# Patient Record
Sex: Female | Born: 1970 | Race: White | Hispanic: No | Marital: Married | Smoking: Former smoker
Health system: Southern US, Community
[De-identification: ages and names within clinical notes are randomized; demographics above are authoritative.]

## PROBLEM LIST (undated history)

## (undated) DIAGNOSIS — E669 Obesity, unspecified: Secondary | ICD-10-CM

## (undated) DIAGNOSIS — O24419 Gestational diabetes mellitus in pregnancy, unspecified control: Secondary | ICD-10-CM

## (undated) DIAGNOSIS — E039 Hypothyroidism, unspecified: Secondary | ICD-10-CM

## (undated) DIAGNOSIS — N6019 Diffuse cystic mastopathy of unspecified breast: Secondary | ICD-10-CM

## (undated) DIAGNOSIS — E785 Hyperlipidemia, unspecified: Secondary | ICD-10-CM

## (undated) DIAGNOSIS — C73 Malignant neoplasm of thyroid gland: Secondary | ICD-10-CM

## (undated) DIAGNOSIS — N200 Calculus of kidney: Secondary | ICD-10-CM

## (undated) HISTORY — PX: BREAST LUMPECTOMY: SHX2

## (undated) HISTORY — DX: Hypothyroidism, unspecified: E03.9

## (undated) HISTORY — PX: THYROIDECTOMY: SHX17

## (undated) HISTORY — DX: Hyperlipidemia, unspecified: E78.5

## (undated) HISTORY — PX: WISDOM TOOTH EXTRACTION: SHX21

## (undated) HISTORY — PX: CHOLECYSTECTOMY: SHX55

## (undated) HISTORY — DX: Malignant neoplasm of thyroid gland: C73

## (undated) HISTORY — DX: Calculus of kidney: N20.0

## (undated) HISTORY — DX: Diffuse cystic mastopathy of unspecified breast: N60.19

## (undated) HISTORY — DX: Gestational diabetes mellitus in pregnancy, unspecified control: O24.419

## (undated) HISTORY — DX: Obesity, unspecified: E66.9

---

## 2011-07-20 ENCOUNTER — Other Ambulatory Visit: Payer: Self-pay | Admitting: Family Medicine

## 2011-07-20 DIAGNOSIS — Z1231 Encounter for screening mammogram for malignant neoplasm of breast: Secondary | ICD-10-CM

## 2011-08-10 ENCOUNTER — Ambulatory Visit
Admission: RE | Admit: 2011-08-10 | Discharge: 2011-08-10 | Disposition: A | Discharge status: Home / Self Care | Payer: 59 | Source: Ambulatory Visit | Attending: Family Medicine | Admitting: Family Medicine

## 2011-08-10 DIAGNOSIS — Z1231 Encounter for screening mammogram for malignant neoplasm of breast: Secondary | ICD-10-CM

## 2011-08-11 ENCOUNTER — Other Ambulatory Visit: Payer: Self-pay | Admitting: Family Medicine

## 2011-08-11 DIAGNOSIS — N63 Unspecified lump in unspecified breast: Secondary | ICD-10-CM

## 2011-08-28 ENCOUNTER — Ambulatory Visit
Admission: RE | Admit: 2011-08-28 | Discharge: 2011-08-28 | Disposition: A | Discharge status: Home / Self Care | Payer: 59 | Source: Ambulatory Visit | Attending: Family Medicine | Admitting: Family Medicine

## 2011-08-28 ENCOUNTER — Other Ambulatory Visit: Payer: Self-pay | Admitting: Family Medicine

## 2011-08-28 DIAGNOSIS — N63 Unspecified lump in unspecified breast: Secondary | ICD-10-CM

## 2012-04-20 ENCOUNTER — Ambulatory Visit: Payer: 59 | Admitting: Cardiology

## 2012-05-24 ENCOUNTER — Ambulatory Visit: Payer: 59 | Admitting: Cardiology

## 2012-07-07 ENCOUNTER — Encounter: Payer: Self-pay | Admitting: Cardiology

## 2012-07-07 ENCOUNTER — Encounter: Payer: Self-pay | Admitting: *Deleted

## 2012-07-07 ENCOUNTER — Ambulatory Visit (INDEPENDENT_AMBULATORY_CARE_PROVIDER_SITE_OTHER): Payer: 59 | Admitting: Cardiology

## 2012-07-07 VITALS — BP 108/60 | HR 53 | Wt 261.0 lb

## 2012-07-07 DIAGNOSIS — R002 Palpitations: Secondary | ICD-10-CM | POA: Insufficient documentation

## 2012-07-07 DIAGNOSIS — E039 Hypothyroidism, unspecified: Secondary | ICD-10-CM

## 2012-07-07 NOTE — Progress Notes (Signed)
  HPI: extremely pleasant 41 year old female for evaluation of palpitations. Patient states that since her thyroid was removed in 2008 she has had occasional palpitations. They are described as a brief flutter and nonsustained. She otherwise does not have dyspnea on exertion, orthopnea, PND, pedal edema, syncope or chest pain.  Current Outpatient Prescriptions  Medication Sig Dispense Refill  . SYNTHROID 150 MCG tablet Take 1 tablet by mouth Daily.        Allergies  Allergen Reactions  . Ciprofloxacin     Past Medical History  Diagnosis Date  . Hypothyroidism   . Obesity   . Fibrocystic breast disease   . Thyroid cancer   . Hyperlipidemia   . Nephrolithiasis   . Gestational diabetes mellitus     Past Surgical History  Procedure Date  . Cholecystectomy   . Thyroidectomy   . Breast lumpectomy     L breast; benign    History   Social History  . Marital Status: Married    Spouse Name: N/A    Number of Children: 2  . Years of Education: N/A   Occupational History  .      Homemaker   Social History Main Topics  . Smoking status: Former Games developer  . Smokeless tobacco: Not on file  . Alcohol Use: Yes     Occasional  . Drug Use: No  . Sexually Active: Not on file   Other Topics Concern  . Not on file   Social History Narrative  . No narrative on file    Family History  Problem Relation Age of Onset  . Heart disease Father     Atrial fibrillation and pacemaker    ROS: no fevers or chills, productive cough, hemoptysis, dysphasia, odynophagia, melena, hematochezia, dysuria, hematuria, rash, seizure activity, orthopnea, PND, pedal edema, claudication. Remaining systems are negative.  Physical Exam:   Blood pressure 108/60, pulse 53, weight 261 lb (118.389 kg).  General:  Well developed/obese in NAD Skin warm/dry Patient not depressed No peripheral clubbing Back-normal HEENT-normal/normal eyelids Neck supple/normal carotid upstroke bilaterally; no bruits; no  JVD; no thyromegaly chest - CTA/ normal expansion CV - RRR/normal S1 and S2; no murmurs, rubs or gallops;  PMI nondisplaced Abdomen -NT/ND, no HSM, no mass, + bowel sounds, no bruit 2+ femoral pulses, no bruits Ext-no edema, chords, 2+ DP Neuro-grossly nonfocal  ECG sinus rhythm at a rate of 53. No significant ST changes.

## 2012-07-07 NOTE — Assessment & Plan Note (Signed)
Most likely PACs or PVCs. Schedule echocardiogram. Her thyroid is followed closely by primary care. We discussed a CardioNet. However her symptoms do not sound to be significantly problematic. If they worsen we will consider a monitor in the future. We could also consider a beta blocker in the future if needed.

## 2012-07-07 NOTE — Assessment & Plan Note (Signed)
Continue Synthroid °

## 2012-07-07 NOTE — Patient Instructions (Addendum)
Your physician recommends that you schedule a follow-up appointment in: AS NEEDED  Your physician has requested that you have an echocardiogram. Echocardiography is a painless test that uses sound waves to create images of your heart. It provides your doctor with information about the size and shape of your heart and how well your heart's chambers and valves are working. This procedure takes approximately one hour. There are no restrictions for this procedure.   

## 2012-07-21 ENCOUNTER — Ambulatory Visit (HOSPITAL_COMMUNITY): Payer: 59 | Attending: Cardiology | Admitting: Radiology

## 2012-07-21 DIAGNOSIS — R002 Palpitations: Secondary | ICD-10-CM | POA: Insufficient documentation

## 2012-07-21 NOTE — Progress Notes (Signed)
Echocardiogram performed.  

## 2012-07-27 ENCOUNTER — Telehealth: Payer: Self-pay | Admitting: Cardiology

## 2012-07-27 NOTE — Telephone Encounter (Signed)
Pt rtn debra's call from monday

## 2012-07-27 NOTE — Telephone Encounter (Signed)
Spoke with pt, aware of normal echo results 

## 2012-08-11 ENCOUNTER — Other Ambulatory Visit: Payer: Self-pay | Admitting: Family Medicine

## 2012-08-11 DIAGNOSIS — N63 Unspecified lump in unspecified breast: Secondary | ICD-10-CM

## 2012-08-18 ENCOUNTER — Ambulatory Visit
Admission: RE | Admit: 2012-08-18 | Discharge: 2012-08-18 | Disposition: A | Discharge status: Home / Self Care | Payer: 59 | Source: Ambulatory Visit | Attending: Family Medicine | Admitting: Family Medicine

## 2012-08-18 DIAGNOSIS — N63 Unspecified lump in unspecified breast: Secondary | ICD-10-CM

## 2013-04-03 NOTE — H&P (Signed)
Deborah Vang is an 42 y.o. female. She is admitted for NovaSure endometrial ablation.  Pertinent Gynecological History: Menses: Heavy and lasts 7-10 days per month Contraception: none(conceived with IVF) DES exposure: denies Blood transfusions: none Sexually transmitted diseases: no past history Previous GYN Procedures: DNC  Last mammogram: normal  Last pap: normal Date: 2013 OB History: G1, P1102(twins)     Past Medical History  Diagnosis Date  . Hypothyroidism   . Obesity   . Fibrocystic breast disease   . Thyroid cancer   . Hyperlipidemia   . Nephrolithiasis   . Gestational diabetes mellitus     Past Surgical History  Procedure Laterality Date  . Cholecystectomy    . Thyroidectomy    . Breast lumpectomy      L breast; benign  Cesarean section   Family History  Problem Relation Age of Onset  . Heart disease Father     Atrial fibrillation and pacemaker    Social History:  reports that she has quit smoking. She does not have any smokeless tobacco history on file. She reports that  drinks alcohol. She reports that she does not use illicit drugs.  Allergies:  Allergies  Allergen Reactions  . Ciprofloxacin     No prescriptions prior to admission    Review of Systems  Constitutional: Negative.   HENT: Negative.   Respiratory: Negative.   Cardiovascular: Negative.   Gastrointestinal: Negative.   Genitourinary: Negative.   Skin: Negative.     There were no vitals taken for this visit. Physical Exam  Constitutional: She is oriented to person, place, and time. She appears well-developed and well-nourished.  HENT:  Head: Normocephalic.  Eyes: Pupils are equal, round, and reactive to light.  Neck: Neck supple.  S/p thyroidectomy  Cardiovascular: Normal rate.   Respiratory: Effort normal.  GI: Soft.  Genitourinary: Vagina normal and uterus normal.  Neurological: She is alert and oriented to person, place, and time.  Skin: Skin is warm and dry.     Assessment/Plan: Persistent abnormal vaginal bleeding which did not resolve with Mirena IUD and interferes with her quality of life.  Will proceed with NovaSure endometrial ablation.  Bessie Boyte D 04/03/2013, 9:54 PM

## 2013-04-04 ENCOUNTER — Encounter (HOSPITAL_COMMUNITY): Payer: Self-pay | Admitting: Pharmacy Technician

## 2013-04-07 ENCOUNTER — Encounter (HOSPITAL_COMMUNITY): Payer: Self-pay | Admitting: Anesthesiology

## 2013-04-07 ENCOUNTER — Ambulatory Visit (HOSPITAL_COMMUNITY)
Admission: RE | Admit: 2013-04-07 | Discharge: 2013-04-07 | Disposition: A | Discharge status: Home / Self Care | Payer: 59 | Source: Ambulatory Visit | Attending: Obstetrics & Gynecology | Admitting: Obstetrics & Gynecology

## 2013-04-07 ENCOUNTER — Encounter (HOSPITAL_COMMUNITY): Payer: Self-pay | Admitting: *Deleted

## 2013-04-07 ENCOUNTER — Encounter (HOSPITAL_COMMUNITY)
Admission: RE | Disposition: A | Discharge status: Home / Self Care | Payer: Self-pay | Source: Ambulatory Visit | Attending: Obstetrics & Gynecology

## 2013-04-07 ENCOUNTER — Ambulatory Visit (HOSPITAL_COMMUNITY): Payer: 59 | Admitting: Anesthesiology

## 2013-04-07 DIAGNOSIS — N920 Excessive and frequent menstruation with regular cycle: Secondary | ICD-10-CM | POA: Insufficient documentation

## 2013-04-07 HISTORY — PX: NOVASURE ABLATION: SHX5394

## 2013-04-07 LAB — CBC
HCT: 39.8 % (ref 36.0–46.0)
Platelets: 226 10*3/uL (ref 150–400)
RDW: 13.9 % (ref 11.5–15.5)
WBC: 7.5 10*3/uL (ref 4.0–10.5)

## 2013-04-07 LAB — PREGNANCY, URINE: Preg Test, Ur: NEGATIVE

## 2013-04-07 SURGERY — NOVASURE ABLATION
Anesthesia: General | Site: Uterus | Wound class: Clean Contaminated

## 2013-04-07 MED ORDER — LIDOCAINE HCL (CARDIAC) 20 MG/ML IV SOLN
INTRAVENOUS | Status: AC
  Filled 2013-04-07: qty 5

## 2013-04-07 MED ORDER — MIDAZOLAM HCL 2 MG/2ML IJ SOLN
INTRAMUSCULAR | Status: AC
  Filled 2013-04-07: qty 2

## 2013-04-07 MED ORDER — MIDAZOLAM HCL 5 MG/5ML IJ SOLN
INTRAMUSCULAR | Status: DC | PRN
  Administered 2013-04-07: 2 mg via INTRAVENOUS

## 2013-04-07 MED ORDER — LACTATED RINGERS IV SOLN
Freq: Once | INTRAVENOUS | Status: AC
  Administered 2013-04-07 (×2): via INTRAVENOUS

## 2013-04-07 MED ORDER — ONDANSETRON HCL 4 MG/2ML IJ SOLN
INTRAMUSCULAR | Status: DC | PRN
  Administered 2013-04-07: 4 mg via INTRAVENOUS

## 2013-04-07 MED ORDER — PROPOFOL 10 MG/ML IV BOLUS
INTRAVENOUS | Status: DC | PRN
  Administered 2013-04-07: 50 mg via INTRAVENOUS
  Administered 2013-04-07: 250 mg via INTRAVENOUS

## 2013-04-07 MED ORDER — OXYCODONE-ACETAMINOPHEN 5-325 MG PO TABS: 1.0000 | ORAL_TABLET | ORAL | Status: DC | PRN

## 2013-04-07 MED ORDER — LIDOCAINE HCL (CARDIAC) 20 MG/ML IV SOLN
INTRAVENOUS | Status: DC | PRN
  Administered 2013-04-07: 50 mg via INTRAVENOUS

## 2013-04-07 MED ORDER — ONDANSETRON HCL 4 MG/2ML IJ SOLN
INTRAMUSCULAR | Status: AC
  Filled 2013-04-07: qty 2

## 2013-04-07 MED ORDER — FENTANYL CITRATE 0.05 MG/ML IJ SOLN
INTRAMUSCULAR | Status: DC | PRN
  Administered 2013-04-07 (×2): 50 ug via INTRAVENOUS

## 2013-04-07 MED ORDER — FENTANYL CITRATE 0.05 MG/ML IJ SOLN
INTRAMUSCULAR | Status: AC
  Filled 2013-04-07: qty 2

## 2013-04-07 MED ORDER — OXYCODONE-ACETAMINOPHEN 5-325 MG PO TABS
ORAL_TABLET | ORAL | Status: AC
  Administered 2013-04-07: 1 via ORAL
  Filled 2013-04-07: qty 1

## 2013-04-07 MED ORDER — LIDOCAINE HCL 2 % IJ SOLN
INTRAMUSCULAR | Status: DC | PRN
  Administered 2013-04-07: 10 mL

## 2013-04-07 MED ORDER — KETOROLAC TROMETHAMINE 30 MG/ML IJ SOLN
INTRAMUSCULAR | Status: AC
  Filled 2013-04-07: qty 1

## 2013-04-07 MED ORDER — LIDOCAINE HCL 2 % IJ SOLN
INTRAMUSCULAR | Status: AC
  Filled 2013-04-07: qty 20

## 2013-04-07 MED ORDER — PROPOFOL 10 MG/ML IV EMUL
INTRAVENOUS | Status: AC
  Filled 2013-04-07: qty 40

## 2013-04-07 MED ORDER — KETOROLAC TROMETHAMINE 30 MG/ML IJ SOLN
INTRAMUSCULAR | Status: DC | PRN
  Administered 2013-04-07: 30 mg via INTRAVENOUS

## 2013-04-07 SURGICAL SUPPLY — 12 items
ABLATOR ENDOMETRIAL BIPOLAR (ABLATOR) ×2 IMPLANT
CATH ROBINSON RED A/P 16FR (CATHETERS) ×2 IMPLANT
CLOTH BEACON ORANGE TIMEOUT ST (SAFETY) ×2 IMPLANT
CONTAINER PREFILL 10% NBF 60ML (FORM) IMPLANT
GLOVE ECLIPSE 6.0 STRL STRAW (GLOVE) ×4 IMPLANT
GOWN PREVENTION PLUS LG XLONG (DISPOSABLE) ×4 IMPLANT
NEEDLE SPNL 22GX3.5 QUINCKE BK (NEEDLE) ×2 IMPLANT
PACK VAGINAL MINOR WOMEN LF (CUSTOM PROCEDURE TRAY) ×2 IMPLANT
PAD PREP 24X48 CUFFED NSTRL (MISCELLANEOUS) ×2 IMPLANT
SYR CONTROL 10ML LL (SYRINGE) ×2 IMPLANT
TOWEL OR 17X24 6PK STRL BLUE (TOWEL DISPOSABLE) ×4 IMPLANT
WATER STERILE IRR 1000ML POUR (IV SOLUTION) ×2 IMPLANT

## 2013-04-07 NOTE — Anesthesia Postprocedure Evaluation (Signed)
  Anesthesia Post Note  Patient: Deborah Vang  Procedure(s) Performed: Procedure(s) (LRB): NOVASURE ABLATION (N/A)  Anesthesia type: GA  Patient location: PACU  Post pain: Pain level controlled  Post assessment: Post-op Vital signs reviewed  Last Vitals:  Filed Vitals:   04/07/13 1315  BP: 109/53  Pulse: 51  Temp:   Resp: 16    Post vital signs: Reviewed  Level of consciousness: sedated  Complications: No apparent anesthesia complications

## 2013-04-07 NOTE — Transfer of Care (Signed)
Immediate Anesthesia Transfer of Care Note  Patient: Deborah Vang  Procedure(s) Performed: Procedure(s): NOVASURE ABLATION (N/A)  Patient Location: PACU  Anesthesia Type:General  Level of Consciousness: awake, alert  and oriented  Airway & Oxygen Therapy: Patient Spontanous Breathing and Patient connected to nasal cannula oxygen  Post-op Assessment: Report given to PACU RN and Post -op Vital signs reviewed and stable  Post vital signs: Reviewed and stable  Complications: No apparent anesthesia complications

## 2013-04-07 NOTE — Op Note (Signed)
Patient Name: Deborah Vang MRN: [id number]  Date of Surgery: 04/07/2013    PREOPERATIVE DIAGNOSIS: Menorrhagia 40981  POSTOPERATIVE DIAGNOSIS: Menorrhagia   PROCEDURE: Novasure endometrial ablation  SURGEON: Caralyn Guile. Arlyce Dice M.D.  ANESTHESIA: General, Paracervical block  ESTIMATED BLOOD LOSS: Minimal  FINDINGS: Palpably normal uterus.  Sounded to 10 cm.  Cervix sounded to 4 cm.   INDICATIONS: Menorrhagia.  Did not respond to Mirena IUD.  PROCEDURE IN DETAIL: The patient was taken to the OR and placed in the dorso-lithotomy position. The perineum and vagina were prepped and draped in a sterile fashion. Bimanual exam revealed an anteverted,normal week sized uterus. 10 ml of 2% lidocaine was infiltrated in the paracervical tissue.  The cervix was sounded to 4 cm and the cavity depth was calculated as 6 cm.   Pratt dilators were used to open the cervix to 25 [place]. A Novasure instrument was placed to the fundus and the cavity width was noted to be 4.3 cm.  A 75 second ablation was carried out without complication.  The procedure was then terminated and the patient left the operating room in good condition.

## 2013-04-07 NOTE — Progress Notes (Signed)
I have interviewed and performed the pertinent exams on my patient to confirm that there have been no significant changes in her condition since the dictation of her history and physical exam.  

## 2013-04-07 NOTE — Anesthesia Preprocedure Evaluation (Signed)
Anesthesia Evaluation  Patient identified by MRN, date of birth, ID band Patient awake    Reviewed: Allergy & Precautions, H&P , Patient's Chart, lab work & pertinent test results, reviewed documented beta blocker date and time   History of Anesthesia Complications Negative for: history of anesthetic complications  Airway Mallampati: III TM Distance: >3 FB Neck ROM: full    Dental no notable dental hx.    Pulmonary neg pulmonary ROS,  breath sounds clear to auscultation  Pulmonary exam normal       Cardiovascular Exercise Tolerance: Good negative cardio ROS  Rhythm:regular Rate:Normal     Neuro/Psych negative neurological ROS  negative psych ROS   GI/Hepatic negative GI ROS, Neg liver ROS,   Endo/Other  negative endocrine ROSHypothyroidism Morbid obesity  Renal/GU negative Renal ROS     Musculoskeletal   Abdominal   Peds  Hematology negative hematology ROS (+)   Anesthesia Other Findings Hypothyroidism     Obesity        Fibrocystic breast disease     Thyroid cancer        Hyperlipidemia     Nephrolithiasis        Gestational diabetes mellitus                 Reproductive/Obstetrics negative OB ROS                           Anesthesia Physical Anesthesia Plan  ASA: III  Anesthesia Plan: General LMA   Post-op Pain Management:    Induction:   Airway Management Planned:   Additional Equipment:   Intra-op Plan:   Post-operative Plan:   Informed Consent: I have reviewed the patients History and Physical, chart, labs and discussed the procedure including the risks, benefits and alternatives for the proposed anesthesia with the patient or authorized representative who has indicated his/her understanding and acceptance.   Dental Advisory Given  Plan Discussed with: CRNA, Surgeon and Anesthesiologist  Anesthesia Plan Comments:         Anesthesia Quick Evaluation

## 2013-04-10 ENCOUNTER — Encounter (HOSPITAL_COMMUNITY): Payer: Self-pay | Admitting: Obstetrics & Gynecology

## 2013-08-16 ENCOUNTER — Other Ambulatory Visit: Payer: Self-pay

## 2013-08-16 DIAGNOSIS — Z803 Family history of malignant neoplasm of breast: Secondary | ICD-10-CM

## 2013-08-16 DIAGNOSIS — Z1231 Encounter for screening mammogram for malignant neoplasm of breast: Secondary | ICD-10-CM

## 2013-08-25 ENCOUNTER — Ambulatory Visit
Admission: RE | Admit: 2013-08-25 | Discharge: 2013-08-25 | Disposition: A | Discharge status: Home / Self Care | Payer: 59 | Source: Ambulatory Visit

## 2013-08-25 DIAGNOSIS — Z1231 Encounter for screening mammogram for malignant neoplasm of breast: Secondary | ICD-10-CM

## 2013-08-25 DIAGNOSIS — Z803 Family history of malignant neoplasm of breast: Secondary | ICD-10-CM

## 2013-08-29 ENCOUNTER — Other Ambulatory Visit: Payer: Self-pay | Admitting: Family Medicine

## 2013-08-29 DIAGNOSIS — R928 Other abnormal and inconclusive findings on diagnostic imaging of breast: Secondary | ICD-10-CM

## 2013-08-30 ENCOUNTER — Other Ambulatory Visit: Payer: Self-pay | Admitting: Family Medicine

## 2013-08-30 ENCOUNTER — Ambulatory Visit
Admission: RE | Admit: 2013-08-30 | Discharge: 2013-08-30 | Disposition: A | Discharge status: Home / Self Care | Payer: 59 | Source: Ambulatory Visit | Attending: Family Medicine | Admitting: Family Medicine

## 2013-08-30 DIAGNOSIS — R921 Mammographic calcification found on diagnostic imaging of breast: Secondary | ICD-10-CM

## 2013-08-30 DIAGNOSIS — R928 Other abnormal and inconclusive findings on diagnostic imaging of breast: Secondary | ICD-10-CM

## 2013-08-31 ENCOUNTER — Ambulatory Visit
Admission: RE | Admit: 2013-08-31 | Discharge: 2013-08-31 | Disposition: A | Discharge status: Home / Self Care | Payer: 59 | Source: Ambulatory Visit | Attending: Family Medicine | Admitting: Family Medicine

## 2013-08-31 DIAGNOSIS — R921 Mammographic calcification found on diagnostic imaging of breast: Secondary | ICD-10-CM

## 2013-09-01 ENCOUNTER — Other Ambulatory Visit: Payer: Self-pay | Admitting: Family Medicine

## 2013-09-01 DIAGNOSIS — D0511 Intraductal carcinoma in situ of right breast: Secondary | ICD-10-CM

## 2013-09-05 ENCOUNTER — Other Ambulatory Visit (INDEPENDENT_AMBULATORY_CARE_PROVIDER_SITE_OTHER): Payer: Self-pay | Admitting: Surgery

## 2013-09-05 ENCOUNTER — Encounter (INDEPENDENT_AMBULATORY_CARE_PROVIDER_SITE_OTHER): Payer: Self-pay

## 2013-09-05 ENCOUNTER — Encounter: Payer: Self-pay | Admitting: *Deleted

## 2013-09-05 ENCOUNTER — Encounter (INDEPENDENT_AMBULATORY_CARE_PROVIDER_SITE_OTHER): Payer: Self-pay | Admitting: Surgery

## 2013-09-05 ENCOUNTER — Ambulatory Visit (INDEPENDENT_AMBULATORY_CARE_PROVIDER_SITE_OTHER): Payer: 59 | Admitting: Surgery

## 2013-09-05 VITALS — BP 120/80 | HR 68 | Temp 98.8°F | Resp 14 | Ht 69.5 in | Wt 253.2 lb

## 2013-09-05 DIAGNOSIS — D0511 Intraductal carcinoma in situ of right breast: Secondary | ICD-10-CM

## 2013-09-05 DIAGNOSIS — D0510 Intraductal carcinoma in situ of unspecified breast: Secondary | ICD-10-CM | POA: Insufficient documentation

## 2013-09-05 DIAGNOSIS — D0590 Unspecified type of carcinoma in situ of unspecified breast: Secondary | ICD-10-CM

## 2013-09-05 NOTE — Progress Notes (Signed)
Received referral from [place] at [facility] in my workque.  I have gave the paperwork to [facility] to obtain an appt from the [facility].  I am able to schedule her for genetics, but will wait on Dawn to call the pt only once.  I emailed Annie at [facility] to make her aware.

## 2013-09-05 NOTE — Progress Notes (Signed)
Patient ID: Deborah Vang, female   DOB: 07-15-1971, 42 y.o.   MRN: [id number]  Chief Complaint  Patient presents with  . New Evaluation    eval new br cancer RT    HPI Deborah Vang is a 42 y.o. female.   HPI This is a pleasant female referred by Dr. Cyndia Bent after the recent diagnosis of ductal carcinoma in situ of the right breast. This was done a recent screening mammography. She has had a previous biopsy of the left breast which was benign. She has had no further problems regarding her breast. She denies nipple discharge. She is otherwise without complaints. She is scheduled for an MRI of her breast next week. Her mother had surgery for ductal carcinoma in situ and had radiation in her early 49s. Past Medical History  Diagnosis Date  . Hypothyroidism   . Obesity   . Fibrocystic breast disease   . Thyroid cancer   . Hyperlipidemia   . Nephrolithiasis   . Gestational diabetes mellitus     Past Surgical History  Procedure Laterality Date  . Cholecystectomy    . Thyroidectomy    . Breast lumpectomy      L breast; benign  . Cesarean section  2010  . Novasure ablation N/A 04/07/2013    Procedure: NOVASURE ABLATION;  Surgeon: Mickel Baas, MD;  Location: [facility];  Service: Gynecology;  Laterality: N/A;  . Wisdom tooth extraction      Family History  Problem Relation Age of Onset  . Heart disease Father     Atrial fibrillation and pacemaker  . Cancer Mother     breast    Social History History  Substance Use Topics  . Smoking status: Former Smoker    Quit date: 09/06/2003  . Smokeless tobacco: Never Used  . Alcohol Use: Yes     Comment: Occasional    Allergies  Allergen Reactions  . Ciprofloxacin Rash    Current Outpatient Prescriptions  Medication Sig Dispense Refill  . fish oil-omega-3 fatty acids 1000 MG capsule Take 1 g by mouth daily.      Marland Kitchen FLUoxetine (PROZAC) 20 MG capsule Take 20 mg by mouth at bedtime.      . Multiple Vitamin (MULTIVITAMIN WITH  MINERALS) TABS Take 1 tablet by mouth daily.      Marland Kitchen SYNTHROID 150 MCG tablet Take 1 tablet by mouth Daily.       No current facility-administered medications for this visit.    Review of Systems Review of Systems  Constitutional: Negative for fever, chills and unexpected weight change.  HENT: Negative for congestion, hearing loss, sore throat, trouble swallowing and voice change.   Eyes: Negative for visual disturbance.  Respiratory: Negative for cough and wheezing.   Cardiovascular: Negative for chest pain, palpitations and leg swelling.  Gastrointestinal: Negative for nausea, vomiting, abdominal pain, diarrhea, constipation, blood in stool, abdominal distention and anal bleeding.  Genitourinary: Negative for hematuria, vaginal bleeding and difficulty urinating.  Musculoskeletal: Negative for arthralgias.  Skin: Negative for rash and wound.  Neurological: Negative for seizures, syncope and headaches.  Hematological: Negative for adenopathy. Does not bruise/bleed easily.  Psychiatric/Behavioral: Negative for confusion.    Blood pressure 120/80, pulse 68, temperature 98.8 F (37.1 C), temperature source Temporal, resp. rate 14, height 5' 9.5" (1.765 m), weight 253 lb 3.2 oz (114.851 kg), last menstrual period 08/17/2013.  Physical Exam Physical Exam  Constitutional: She is oriented to person, place, and time. She appears well-developed and well-nourished. No distress.  HENT:  Head: Normocephalic and atraumatic.  Right Ear: External ear normal.  Left Ear: External ear normal.  Nose: Nose normal.  Mouth/Throat: Oropharynx is clear and moist. No oropharyngeal exudate.  Eyes: Conjunctivae are normal. Pupils are equal, round, and reactive to light. Right eye exhibits no discharge. Left eye exhibits no discharge. No scleral icterus.  Neck: Normal range of motion. Neck supple. No tracheal deviation present.  Cardiovascular: Normal rate, regular rhythm, normal heart sounds and intact  distal pulses.   No murmur heard. Pulmonary/Chest: Effort normal and breath sounds normal. No respiratory distress. She has no wheezes. She has no rales.  Abdominal: Soft. There is no tenderness.  Musculoskeletal: Normal range of motion. She exhibits no edema and no tenderness.  Lymphadenopathy:    She has no cervical adenopathy.    She has no axillary adenopathy.  Neurological: She is alert and oriented to person, place, and time.  Skin: Skin is warm and dry. No rash noted. She is not diaphoretic. No erythema.  Psychiatric: Her behavior is normal.  Breasts: There are no palpable masses in either breast. [place] are normal. There is minimal ecchymosis of right breast from the biopsy  Data Reviewed Her mammogram showed the area of calcifications in the right breast to be approximately 5 cm x 2 cm x 2 cm in size. Pathology revealed ductal carcinoma in situ with some necrosis but no invasive disease. Receptor status is pending  Assessment    DCIS of the right breast     Plan    She is scheduled for MRI next week. I discussed all the potential options with her. She is being referred to the cancer center for evaluation by the medical and radiation oncologist as well as genetic counseling. I will call her back with the results of the MRI. She may be a candidate for lumpectomy and sentinel node biopsy depending on the size and extent of the disease.        Deborah Vang A 09/05/2013, 10:20 AM

## 2013-09-07 ENCOUNTER — Telehealth: Payer: Self-pay | Admitting: *Deleted

## 2013-09-07 NOTE — Telephone Encounter (Signed)
Obtained appt from Dr. Welton Flakes.  Called and confirmed 09/12/13 med onc appt & 09/18/13 genetic appt w/ pt.  Mailed before appt letter, welcome packet & intake form to pt.  Emailed Annie at [facility] to make her aware.  Took paperwork to [facility] for chart.

## 2013-09-08 ENCOUNTER — Encounter: Payer: Self-pay | Admitting: *Deleted

## 2013-09-08 NOTE — Progress Notes (Signed)
Gathered paperwork back from [facility] to make chart myself.

## 2013-09-11 ENCOUNTER — Other Ambulatory Visit: Payer: Self-pay | Admitting: *Deleted

## 2013-09-11 ENCOUNTER — Encounter: Payer: Self-pay | Admitting: *Deleted

## 2013-09-11 DIAGNOSIS — C50411 Malignant neoplasm of upper-outer quadrant of right female breast: Secondary | ICD-10-CM | POA: Insufficient documentation

## 2013-09-11 NOTE — Progress Notes (Signed)
Received chart back from [facility] and I placed it in Dr. Milta Deiters box.

## 2013-09-11 NOTE — Progress Notes (Signed)
Completed chart and gave to Dawn to enter labs and either give back to me or place in Dr. Khan's box. 

## 2013-09-12 ENCOUNTER — Ambulatory Visit (HOSPITAL_BASED_OUTPATIENT_CLINIC_OR_DEPARTMENT_OTHER): Payer: 59

## 2013-09-12 ENCOUNTER — Encounter: Payer: Self-pay | Admitting: Oncology

## 2013-09-12 ENCOUNTER — Telehealth: Payer: Self-pay | Admitting: Oncology

## 2013-09-12 ENCOUNTER — Ambulatory Visit (HOSPITAL_BASED_OUTPATIENT_CLINIC_OR_DEPARTMENT_OTHER): Payer: 59 | Admitting: Oncology

## 2013-09-12 ENCOUNTER — Ambulatory Visit
Admission: RE | Admit: 2013-09-12 | Discharge: 2013-09-12 | Disposition: A | Discharge status: Home / Self Care | Payer: 59 | Source: Ambulatory Visit | Attending: Family Medicine | Admitting: Family Medicine

## 2013-09-12 ENCOUNTER — Other Ambulatory Visit (HOSPITAL_BASED_OUTPATIENT_CLINIC_OR_DEPARTMENT_OTHER): Payer: 59

## 2013-09-12 VITALS — BP 121/79 | HR 65 | Temp 98.7°F | Resp 20 | Ht 69.5 in | Wt 255.0 lb

## 2013-09-12 DIAGNOSIS — D0511 Intraductal carcinoma in situ of right breast: Secondary | ICD-10-CM

## 2013-09-12 DIAGNOSIS — D0590 Unspecified type of carcinoma in situ of unspecified breast: Secondary | ICD-10-CM

## 2013-09-12 DIAGNOSIS — C50411 Malignant neoplasm of upper-outer quadrant of right female breast: Secondary | ICD-10-CM

## 2013-09-12 DIAGNOSIS — Z170 Estrogen receptor positive status [ER+]: Secondary | ICD-10-CM

## 2013-09-12 DIAGNOSIS — C50419 Malignant neoplasm of upper-outer quadrant of unspecified female breast: Secondary | ICD-10-CM

## 2013-09-12 LAB — CBC WITH DIFFERENTIAL/PLATELET
BASO%: 0.9 % (ref 0.0–2.0)
EOS%: 2.4 % (ref 0.0–7.0)
HCT: 39.8 % (ref 34.8–46.6)
MCH: 30.2 pg (ref 25.1–34.0)
MCHC: 33.5 g/dL (ref 31.5–36.0)
NEUT%: 62.9 % (ref 38.4–76.8)
lymph#: 2.7 10*3/uL (ref 0.9–3.3)

## 2013-09-12 LAB — COMPREHENSIVE METABOLIC PANEL (CC13)
ALT: 16 U/L (ref 0–55)
AST: 14 U/L (ref 5–34)
Calcium: 9.5 mg/dL (ref 8.4–10.4)
Chloride: 105 mEq/L (ref 98–109)
Creatinine: 0.8 mg/dL (ref 0.6–1.1)
Total Bilirubin: 0.56 mg/dL (ref 0.20–1.20)

## 2013-09-12 MED ORDER — GADOBENATE DIMEGLUMINE 529 MG/ML IV SOLN
20.0000 mL | Freq: Once | INTRAVENOUS | Status: AC | PRN
  Administered 2013-09-12: 20 mL via INTRAVENOUS

## 2013-09-12 NOTE — Telephone Encounter (Signed)
, °

## 2013-09-12 NOTE — Progress Notes (Signed)
Deborah Vang [id number] 1971/09/28 42 y.o. 09/12/2013 2:39 PM  CC  [name], MD [address] [place] [address] Dr. Abigail Miyamoto  REASON FOR CONSULTATION:  42 year old female with new diagnosis of ductal carcinoma in situ of the right breast. 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 Carman Ching  HISTORY OF PRESENT ILLNESS:  Deborah Vang is a 42 y.o. female.  Without significant past medical history patient has had IVF therapy. Most recently she underwent a screening mammogram was found to have calcifications. These were suspicious and therefore she underwent a stereotactic biopsy. The biopsy revealed ductal carcinoma in situ with calcifications and necrosis. This biopsy was at the 10:00 position. Tumor was estrogen receptor +99% progesterone receptor +99%. Patient was seen by Dr. Rayburn Ma who suggested surgery but did want to get an MRI performed. She has had MRI today. Clinically she is without any complaints.   Past Medical History: Past Medical History  Diagnosis Date  . Hypothyroidism   . Obesity   . Fibrocystic breast disease   . Thyroid cancer   . Hyperlipidemia   . Nephrolithiasis   . Gestational diabetes mellitus     Past Surgical History: Past Surgical History  Procedure Laterality Date  . Cholecystectomy    . Thyroidectomy    . Breast lumpectomy      L breast; benign  . Cesarean section  2010  . Novasure ablation N/A 04/07/2013    Procedure: NOVASURE ABLATION;  Surgeon: Mickel Baas, MD;  Location: [facility];  Service: Gynecology;  Laterality: N/A;  . Wisdom tooth extraction      Family History: Family History  Problem Relation Age of Onset  . Heart disease Father     Atrial fibrillation and pacemaker  . Cancer Mother     breast    Social History History  Substance Use Topics  . Smoking status: Former Smoker    Quit  date: 09/06/2003  . Smokeless tobacco: Never Used  . Alcohol Use: Yes     Comment: Occasional    Allergies: Allergies  Allergen Reactions  . Ciprofloxacin Rash    Current Medications: Current Outpatient Prescriptions  Medication Sig Dispense Refill  . fish oil-omega-3 fatty acids 1000 MG capsule Take 1 g by mouth daily.      Marland Kitchen FLUoxetine (PROZAC) 20 MG capsule Take 20 mg by mouth at bedtime.      . Multiple Vitamin (MULTIVITAMIN WITH MINERALS) TABS Take 1 tablet by mouth daily.      Marland Kitchen SYNTHROID 150 MCG tablet Take 1 tablet by mouth Daily.       No current facility-administered medications for this visit.    OB/GYN History: menarche at 23, pre-menopausal, age at first pregancy 27, IVF therapy  Fertility Discussion: completed Prior History of Cancer: yes  Health Maintenance:  Colonoscopy no Bone Density no Last PAP smear 2014  ECOG PERFORMANCE STATUS: 0 - Asymptomatic  Genetic Counseling/testing: performed at [facility]  REVIEW OF SYSTEMS:  A comprehensive review of systems was negative.  PHYSICAL EXAMINATION: Last menstrual period 08/17/2013.  XBJ:YNWGN, no distress, well nourished and well developed SKIN: skin color, texture, turgor are normal HEAD: Normocephalic EYES: PERRLA, EOMI, Conjunctiva are pink and non-injected EARS: External ears normal OROPHARYNX:no exudate and lips, buccal mucosa, and tongue normal  NECK: no adenopathy LYMPH:  no palpable lymphadenopathy BREAST:breasts appear normal, no suspicious masses, no skin or nipple changes  or axillary nodes LUNGS: clear to auscultation and percussion HEART: regular rate & rhythm ABDOMEN:abdomen soft, non-tender, normal bowel sounds and no masses or organomegaly BACK: Back symmetric, no curvature. EXTREMITIES:less then 2 second capillary refill, no edema, no clubbing, no cyanosis  NEURO: alert & oriented x 3 with fluent speech, no focal motor/sensory deficits, gait normal     STUDIES/RESULTS: Mr Breast  Bilateral W Wo Contrast  09/12/2013   CLINICAL DATA:  Recently diagnosed right breast ductal carcinoma in situ.  EXAM: BILATERAL BREAST MRI WITH AND WITHOUT CONTRAST  LABS:  None obtained today.  TECHNIQUE: Multiplanar, multisequence MR images of both breasts were obtained prior to and following the intravenous administration of 20ml of MultiHance.  THREE-DIMENSIONAL MR IMAGE RENDERING ON INDEPENDENT WORKSTATION:  Three-dimensional MR images were rendered by post-processing of the original MR data on an independent workstation. The three-dimensional MR images were interpreted, and findings are reported in the following complete MRI report for this study. Three dimensional images were evaluated at the independent DynaCad workstation  COMPARISON:  Current and previous mammograms and stereotactic biopsy.  FINDINGS: Breast composition: b. Scattered fibroglandular tissue  Background parenchymal enhancement: Mild, nodular  Right breast: 5.6 x 5.3 x 4.0 cm area of ill-defined, non mass enhancement in the upper-outer quadrant of the right breast in the anterior half of the breast. This contains a biopsy marker clip artifact anteriorly and corresponds to the location of the recently biopsied calcifications. No mass or enhancement suspicious for malignancy elsewhere in the right breast.  Left breast: No mass or abnormal enhancement.  Lymph nodes: No abnormal appearing lymph nodes.  Ancillary findings:  None.  IMPRESSION: 5.6 x 5.3 x 4.0 cm area biopsy-proven ductal carcinoma in situ in the anterior half of the upper-outer quadrant of the right breast. Otherwise, unremarkable examination.  RECOMMENDATION: Treatment plan.  BI-RADS CATEGORY  6: Known biopsy-proven malignancy - appropriate action should be taken.   Electronically Signed   By: Gordan Payment M.D.   On: 09/12/2013 12:36   Mm Digital Diag Ltd R  08/30/2013   CLINICAL DATA:  Calcifications right breast identified on recent screening mammogram.  EXAM: DIGITAL  DIAGNOSTIC  RIGHT MAMMOGRAM  COMPARISON:  08/25/2013  ACR Breast Density Category b: There are scattered areas of fibroglandular density.  FINDINGS: Magnification views of the slightly outer and upper right breast demonstrate a region of scattered and clustered faint heterogeneous calcifications, some of which appear linearly oriented. These calcifications span approximately 5 x 2.6 x 2.5 cm. There is no associated mass.  IMPRESSION: Faint heterogeneous calcifications in the upper central right breast. Malignancy cannot be excluded.  RECOMMENDATION: Stereotactic biopsy is recommended and has been scheduled for 08/31/2013. The procedure for stereotactic biopsy was discussed with the patient in detail today.  I have discussed the findings and recommendations with the patient. Results were also provided in writing at the conclusion of the visit. If applicable, a reminder letter will be sent to the patient regarding the next appointment.  BI-RADS CATEGORY  4: Suspicious abnormality - biopsy should be considered.   Electronically Signed   By: Britta Mccreedy M.D.   On: 08/30/2013 12:18   Mm Digital Screening  08/28/2013   CLINICAL DATA:  Screening.  EXAM: DIGITAL SCREENING BILATERAL MAMMOGRAM WITH CAD  DIGITAL BREAST TOMOSYNTHESIS  Digital breast tomosynthesis images are acquired in two projections. These images are reviewed in combination with the digital mammogram, confirming the findings below.  COMPARISON:  08/18/2012, 08/28/2011 from [facility]. 07/02/2010, 08/09/2009 from [facility].  ACR Breast Density Category c: The breasts are heterogeneously dense, which may obscure small masses.  FINDINGS: In the right breast, calcifications warrant further evaluation with magnified views. In the left breast, no mass or malignant type calcifications are identified. Images were processed with CAD.  IMPRESSION: Further evaluation is suggested for calcifications in the  right breast.  RECOMMENDATION: Diagnostic mammogram of the right breast. (Code:[id number])  The patient will be contacted regarding the findings, and additional imaging will be scheduled.  BI-RADS CATEGORY  0: Incomplete. Need additional imaging evaluation and/or prior mammograms for comparison.   Electronically Signed   By: Cain Saupe M.D.   On: 08/28/2013 07:13   Mm Rt Breast Bx W Loc Dev 1st Lesion Image Bx Spec Stereo Guide  09/01/2013   ADDENDUM REPORT: 09/01/2013 14:32  PATHOLOGY: DUCTAL CARCINOMA IN SITU WITH CALCIFICATIONS AND DUCTAL  CARCINOMA IN SITU WITHOUT CALCIFICATIONS: PATHOLOGY: DUCTAL CARCINOMA IN SITU WITH CALCIFICATIONS AND DUCTAL CARCINOMA IN SITU WITHOUT CALCIFICATIONS  CONCORDANT:  Yes  I discussed these results and the recommendations below with the patient by telephone on 08/31/2013 at 2:20 p.m. All of her questions were answered. She denies significant pain or bleeding at the biopsy site. She does report some warmth at the biopsy site and has been advised that if this continues or increases or if she develops other signs of potential infection, such as erythema or fever that she should contact us to have the biopsy site evaluated.  RECOMMENDATION: The patient has been scheduled for surgical consultation with Dr. Magnus Ivan at [facility] on 09/05/2013 at 9:30 a.m. Followup breast MRI is pending scheduling.   Electronically Signed   By: Leda Gauze M.D.   On: 09/01/2013 14:32   09/01/2013   CLINICAL DATA:  The patient presents for stereotactic biopsy of calcifications in the upper-outer portion of the right breast  EXAM: Right STEREOTACTIC CORE NEEDLE BIOPSY  COMPARISON:  08/25/2013 and prior  FINDINGS: The patient and I discussed the procedure of stereotactic-guided biopsy including benefits and alternatives. We discussed the high likelihood of a successful procedure. We discussed the risks of the procedure including infection, bleeding, tissue injury, clip migration,  and inadequate sampling. Informed written consent was given. The usual time out protocol was performed immediately prior to the procedure.  Using sterile technique and 2% Lidocaine as local anesthetic, under stereotactic guidance, a 10 gauge mammotome device was used to perform core needle biopsy of one of the groupings of calcifications in the upper outer portion of the right breast using a lateral to medial approach. Specimen radiograph was performed showing 1 core containing a linear grouping of calcifications. Specimens with calcifications are identified for pathology.  At the conclusion of the procedure, a bow tie shaped tissue marker clip was deployed into the biopsy cavity. Follow-up 2-view mammogram confirmed clip to be positioned 1.7 cm medial to the biopsied area.  IMPRESSION: Stereotactic-guided biopsy of right breast calcifications as described above. No apparent complications.  Electronically Signed: By: Leda Gauze M.D. On: 08/31/2013 16:56     LABS:    Chemistry   No results found for this basename: NA, K, CL, CO2, BUN, CREATININE, GLU   No results found for this basename: CALCIUM, ALKPHOS, AST, ALT, BILITOT      Lab Results  Component Value Date   WBC 9.5 09/12/2013   HGB 13.3 09/12/2013   HCT 39.8 09/12/2013   MCV 90.2 09/12/2013   PLT 243  09/12/2013       PATHOLOGY: ADDITIONAL INFORMATION: 2. PROGNOSTIC INDICATORS - ACIS Results: IMMUNOHISTOCHEMICAL AND MORPHOMETRIC ANALYSIS BY THE AUTOMATED CELLULAR IMAGING SYSTEM (ACIS) Estrogen Receptor: 99%, POSITIVE, STRONG STAINING INTENSITY Progesterone Receptor: 99%, POSITIVE, STRONG STAINING INTENSITY REFERENCE RANGE ESTROGEN RECEPTOR NEGATIVE <1% POSITIVE =>1% PROGESTERONE RECEPTOR NEGATIVE <1% POSITIVE =>1% All controls stained appropriately Pecola Leisure MD Pathologist, Electronic Signature ( Signed 09/06/2013) FINAL DIAGNOSIS Diagnosis 1. Breast, right, needle core biopsy, 10 o'clock (calcifications) -  DUCTAL CARCINOMA IN SITU WITH CALCIFICATIONS AND NECROSIS. 2. Breast, right, needle core biopsy, 10 o'clock, (without calcifications) - DUCTAL CARCINOMA IN SITU. 1 of 2 FINAL for Deborah Vang, Deborah Vang ([id number]) Microscopic Comment 1. and 2. Grading is best performed at excision. The DCIS is low to intermediate grade in these biopsies. Estrogen and progesterone receptors will be performed. Dr. Raynald Blend agrees. Called to [facility] on 09/01/13. (JDP;gt, 09/01/13) Jimmy Picket MD Pathologist, Electronic Signature (Case signed 09/01/2013) Specimen Gross and Clinical Information  ASSESSMENT/PLAN    42 year old female with new diagnosis of ductal carcinoma in situ of the left breast found on a recent screening mammogram. Tumor is ER positive PR positive. Patient is considering mastectomies versus lumpectomy. We reviewed her pathology and radiology.We discussed the pathophysiology of breast cancer as well as treatment options. We discussed multimodality treatment including surgery, radiation, and antiestrogen therapy for DCIS to help prevent future breast cancer risk. Patient apparently has been seen at [facility] by Dr. Gwenyth Bender and Dr. Hollace Kinnier. They discussed possibility of doing bilateral mastectomies with inframammary incision and nipple sparing. She is very excited about this. We discussed extensively what these scenarios would be if she had mastectomy as well as lumpectomy in terms of systemic treatment. Certainly she would be a candidate for tamoxifen daily for 5 years as a chemoprevention treatment. However if she has mastectomy then certainly she does not need to take that.  We discussed side effects of tamoxifen. Patient is planning on going back to [facility] to discuss surgical procedures with both plastic surgery and Gen. Surgery. When she completes that I will see her back.     Thank you so much for allowing me to participate in the care of Deborah Vang. I will continue to  follow up the patient with you and assist in her care.  All questions were answered. The patient knows to call the clinic with any problems, questions or concerns. We can certainly see the patient much sooner if necessary.  I spent 50 minutes counseling the patient face to face. The total time spent in the appointment was 60 minutes.  Drue Second, MD Medical/Oncology [facility] [phone] (beeper) [phone] (Office)  09/12/2013, 2:39 PM

## 2013-09-12 NOTE — Progress Notes (Signed)
Checked in new patient with no financial issues. She has appt card and I gave her the [facility] packet. °

## 2013-09-14 ENCOUNTER — Telehealth: Payer: Self-pay | Admitting: Oncology

## 2013-09-14 NOTE — Telephone Encounter (Signed)
, °

## 2013-09-15 ENCOUNTER — Encounter: Payer: Self-pay | Admitting: *Deleted

## 2013-09-15 NOTE — Progress Notes (Signed)
Mailed after appt letter to pt. 

## 2013-09-18 ENCOUNTER — Encounter: Payer: 59 | Admitting: Genetic Counselor

## 2013-09-18 ENCOUNTER — Other Ambulatory Visit: Payer: 59

## 2013-09-19 ENCOUNTER — Ambulatory Visit: Payer: 59 | Admitting: Radiation Oncology

## 2013-09-19 ENCOUNTER — Ambulatory Visit: Payer: 59

## 2013-11-28 ENCOUNTER — Telehealth: Payer: Self-pay | Admitting: Oncology

## 2013-11-28 NOTE — Telephone Encounter (Signed)
, °

## 2013-12-12 ENCOUNTER — Other Ambulatory Visit: Payer: Self-pay | Admitting: *Deleted

## 2013-12-12 DIAGNOSIS — C50411 Malignant neoplasm of upper-outer quadrant of right female breast: Secondary | ICD-10-CM

## 2013-12-13 ENCOUNTER — Ambulatory Visit (HOSPITAL_BASED_OUTPATIENT_CLINIC_OR_DEPARTMENT_OTHER): Payer: 59 | Admitting: Adult Health

## 2013-12-13 ENCOUNTER — Other Ambulatory Visit: Payer: 59

## 2013-12-13 ENCOUNTER — Telehealth: Payer: Self-pay | Admitting: *Deleted

## 2013-12-13 ENCOUNTER — Other Ambulatory Visit (HOSPITAL_BASED_OUTPATIENT_CLINIC_OR_DEPARTMENT_OTHER): Payer: 59

## 2013-12-13 ENCOUNTER — Ambulatory Visit: Payer: 59 | Admitting: Oncology

## 2013-12-13 ENCOUNTER — Encounter: Payer: Self-pay | Admitting: Adult Health

## 2013-12-13 VITALS — BP 117/76 | HR 69 | Temp 98.3°F | Resp 18 | Ht 69.5 in | Wt 260.9 lb

## 2013-12-13 DIAGNOSIS — C50411 Malignant neoplasm of upper-outer quadrant of right female breast: Secondary | ICD-10-CM

## 2013-12-13 DIAGNOSIS — D0590 Unspecified type of carcinoma in situ of unspecified breast: Secondary | ICD-10-CM

## 2013-12-13 DIAGNOSIS — Z170 Estrogen receptor positive status [ER+]: Secondary | ICD-10-CM

## 2013-12-13 LAB — COMPREHENSIVE METABOLIC PANEL (CC13)
ALT: 15 U/L (ref 0–55)
AST: 11 U/L (ref 5–34)
Albumin: 3.9 g/dL (ref 3.5–5.0)
Alkaline Phosphatase: 48 U/L (ref 40–150)
Anion Gap: 8 mEq/L (ref 3–11)
BILIRUBIN TOTAL: 0.96 mg/dL (ref 0.20–1.20)
BUN: 12.5 mg/dL (ref 7.0–26.0)
CALCIUM: 9.2 mg/dL (ref 8.4–10.4)
CHLORIDE: 107 meq/L (ref 98–109)
CO2: 25 mEq/L (ref 22–29)
CREATININE: 0.7 mg/dL (ref 0.6–1.1)
Glucose: 87 mg/dl (ref 70–140)
Potassium: 4.5 mEq/L (ref 3.5–5.1)
Sodium: 140 mEq/L (ref 136–145)
Total Protein: 7.1 g/dL (ref 6.4–8.3)

## 2013-12-13 LAB — CBC WITH DIFFERENTIAL/PLATELET
BASO%: 1.1 % (ref 0.0–2.0)
Basophils Absolute: 0.1 10*3/uL (ref 0.0–0.1)
EOS%: 4.9 % (ref 0.0–7.0)
Eosinophils Absolute: 0.4 10*3/uL (ref 0.0–0.5)
HEMATOCRIT: 38.8 % (ref 34.8–46.6)
HGB: 12.9 g/dL (ref 11.6–15.9)
LYMPH%: 31 % (ref 14.0–49.7)
MCH: 29.6 pg (ref 25.1–34.0)
MCHC: 33.1 g/dL (ref 31.5–36.0)
MCV: 89.4 fL (ref 79.5–101.0)
MONO#: 0.4 10*3/uL (ref 0.1–0.9)
MONO%: 5.5 % (ref 0.0–14.0)
NEUT#: 4.7 10*3/uL (ref 1.5–6.5)
NEUT%: 57.5 % (ref 38.4–76.8)
Platelets: 237 10*3/uL (ref 145–400)
RBC: 4.34 10*6/uL (ref 3.70–5.45)
RDW: 14.1 % (ref 11.2–14.5)
WBC: 8.1 10*3/uL (ref 3.9–10.3)
lymph#: 2.5 10*3/uL (ref 0.9–3.3)

## 2013-12-13 NOTE — Telephone Encounter (Signed)
appts made and printed...td 

## 2013-12-13 NOTE — Telephone Encounter (Signed)
Pt is aware that she will be called with an appt for echo  Once its precert. gv order to [facility]...td

## 2013-12-13 NOTE — Progress Notes (Signed)
Renetha Vero [id number] 05/11/1971 43 y.o. 12/13/2013 1:53 PM  CC  [place], [place] [id number] Dr. Coralie Keens  DIAGNOSIS:  43 year old female with diagnosis of ductal carcinoma in situ of the right breast.   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 Nedra Hai  PRIOR HISTORY:   Lyah Dewalt is a 43 y.o. female.    1. Without significant past medical history patient has had IVF therapy. Most recently she underwent a screening mammogram was found to have calcifications. These were suspicious and therefore she underwent a stereotactic biopsy. The biopsy revealed ductal carcinoma in situ with calcifications and necrosis. This biopsy was at the 10:00 position. Tumor was estrogen receptor +99% progesterone receptor +99%.   2.  Patient is followed by Dr. Carvel Getting at [facility] and Dr. Abelino Derrick as well.  She underwent a bilateral nipple sparing simple mastectomy on 10/26/13.  It demonstrated DCIS with negative margins.  She had immediate reconstruction and will undergo her final reconstruction on 01/10/14.    INTERVAL HISTORY:  Patient is here for f/u since her surgery.  She is doing well today.  She was told by Dr. Humphrey Rolls that she needs Tamoxifen, however Dr. Carvel Getting has told her that it is not necessary.  The patient would like to know if the two have talked and what the final decision was.  She is doing well since surgery.  She is worried about her breasts being asymmetrical due to her expander dropping lower in her left breast.  Otherwise, a 10 point ROS is neg.   Past Medical History: Past Medical History  Diagnosis Date  . Hypothyroidism   . Obesity   . Fibrocystic breast disease   . Thyroid cancer   . Hyperlipidemia   . Nephrolithiasis   . Gestational diabetes mellitus     Past Surgical History: Past Surgical History  Procedure Laterality Date  . Cholecystectomy    . Thyroidectomy     . Breast lumpectomy      L breast; benign  . Cesarean section  2010  . Novasure ablation N/A 04/07/2013    Procedure: NOVASURE ABLATION;  Surgeon: Sharene Butters, MD;  Location: [facility];  Service: Gynecology;  Laterality: N/A;  . Wisdom tooth extraction      Family History: Family History  Problem Relation Age of Onset  . Heart disease Father     Atrial fibrillation and pacemaker  . Cancer Mother     breast    Social History History  Substance Use Topics  . Smoking status: Former Smoker    Quit date: 09/06/2003  . Smokeless tobacco: Never Used  . Alcohol Use: Yes     Comment: Occasional    Allergies: Allergies  Allergen Reactions  . Ciprofloxacin Rash  . Silver Rash    Blisters    Current Medications: Current Outpatient Prescriptions  Medication Sig Dispense Refill  . Calcium Carb-Cholecalciferol (CALCIUM 1000 + D) 1000-800 MG-UNIT TABS Take by mouth 2 (two) times daily.      Marland Kitchen FLUoxetine (PROZAC) 20 MG capsule Take 20 mg by mouth at bedtime.      Marland Kitchen levothyroxine (SYNTHROID) 150 MCG tablet Take by mouth.      . levothyroxine (SYNTHROID) 175 MCG tablet Take by mouth.      Marland Kitchen LORazepam (ATIVAN) 1 MG tablet Take by mouth.      . Multiple Vitamin (MULTIVITAMIN WITH MINERALS) TABS Take 1 tablet by mouth  daily.      . Olive Leaf Extract 250 MG CAPS Take by mouth.      . fish oil-omega-3 fatty acids 1000 MG capsule Take 1 g by mouth daily.      . Hydrocodone-Acetaminophen 5-300 MG TABS Take by mouth.       No current facility-administered medications for this visit.    REVIEW OF SYSTEMS:  A 10 point review of systems was conducted and is otherwise negative except for what is noted above.     PHYSICAL EXAMINATION: Blood pressure 117/76, pulse 69, temperature 98.3 F (36.8 C), temperature source Oral, resp. rate 18, height 5' 9.5" (1.765 m), weight 260 lb 14.4 oz (118.343 kg). GENERAL: Patient is a well appearing female in no acute distress HEENT:  Sclerae  anicteric.  Oropharynx clear and moist. No ulcerations or evidence of oropharyngeal candidiasis. Neck is supple.  NODES:  No cervical, supraclavicular, or axillary lymphadenopathy palpated.  BREAST EXAM:  S/p bilateral nipple sparing mastectomy, skin intact, left expander lower than right, no swelling, skin changes, or sign of recurrence.   LUNGS:  Clear to auscultation bilaterally.  No wheezes or rhonchi. HEART:  Regular rate and rhythm. No murmur appreciated. ABDOMEN:  Soft, nontender.  Positive, normoactive bowel sounds. No organomegaly palpated. MSK:  No focal spinal tenderness to palpation. Full range of motion bilaterally in the upper extremities. EXTREMITIES:  No peripheral edema.   SKIN:  Clear with no obvious rashes or skin changes. No nail dyscrasia. NEURO:  Nonfocal. Well oriented.  Appropriate affect. ECOG PERFORMANCE STATUS: 0 - Asymptomatic    STUDIES/RESULTS: Mr Breast Bilateral W Wo Contrast  09/12/2013   CLINICAL DATA:  Recently diagnosed right breast ductal carcinoma in situ.  EXAM: BILATERAL BREAST MRI WITH AND WITHOUT CONTRAST  LABS:  None obtained today.  TECHNIQUE: Multiplanar, multisequence MR images of both breasts were obtained prior to and following the intravenous administration of 16ml of MultiHance.  THREE-DIMENSIONAL MR IMAGE RENDERING ON INDEPENDENT WORKSTATION:  Three-dimensional MR images were rendered by post-processing of the original MR data on an independent workstation. The three-dimensional MR images were interpreted, and findings are reported in the following complete MRI report for this study. Three dimensional images were evaluated at the independent DynaCad workstation  COMPARISON:  Current and previous mammograms and stereotactic biopsy.  FINDINGS: Breast composition: b. Scattered fibroglandular tissue  Background parenchymal enhancement: Mild, nodular  Right breast: 5.6 x 5.3 x 4.0 cm area of ill-defined, non mass enhancement in the upper-outer quadrant  of the right breast in the anterior half of the breast. This contains a biopsy marker clip artifact anteriorly and corresponds to the location of the recently biopsied calcifications. No mass or enhancement suspicious for malignancy elsewhere in the right breast.  Left breast: No mass or abnormal enhancement.  Lymph nodes: No abnormal appearing lymph nodes.  Ancillary findings:  None.  IMPRESSION: 5.6 x 5.3 x 4.0 cm area biopsy-proven ductal carcinoma in situ in the anterior half of the upper-outer quadrant of the right breast. Otherwise, unremarkable examination.  RECOMMENDATION: Treatment plan.  BI-RADS CATEGORY  6: Known biopsy-proven malignancy - appropriate action should be taken.   Electronically Signed   By: Enrique Sack M.D.   On: 09/12/2013 12:36   Mm [place] R  08/30/2013   CLINICAL DATA:  Calcifications right breast identified on recent screening mammogram.  EXAM: DIGITAL DIAGNOSTIC  RIGHT MAMMOGRAM  COMPARISON:  08/25/2013  ACR Breast Density Category b: There are scattered areas of fibroglandular  density.  FINDINGS: Magnification views of the slightly outer and upper right breast demonstrate a region of scattered and clustered faint heterogeneous calcifications, some of which appear linearly oriented. These calcifications span approximately 5 x 2.6 x 2.5 cm. There is no associated mass.  IMPRESSION: Faint heterogeneous calcifications in the upper central right breast. Malignancy cannot be excluded.  RECOMMENDATION: Stereotactic biopsy is recommended and has been scheduled for 08/31/2013. The procedure for stereotactic biopsy was discussed with the patient in detail today.  I have discussed the findings and recommendations with the patient. Results were also provided in writing at the conclusion of the visit. If applicable, a reminder letter will be sent to the patient regarding the next appointment.  BI-RADS CATEGORY  4: Suspicious abnormality - biopsy should be considered.   Electronically  Signed   By: Curlene Dolphin M.D.   On: 08/30/2013 12:18   Mm Digital Screening  08/28/2013   CLINICAL DATA:  Screening.  EXAM: DIGITAL SCREENING BILATERAL MAMMOGRAM WITH CAD  DIGITAL BREAST TOMOSYNTHESIS  Digital breast tomosynthesis images are acquired in two projections. These images are reviewed in combination with the digital mammogram, confirming the findings below.  COMPARISON:  08/18/2012, 08/28/2011 from [facility]. 07/02/2010, 08/09/2009 from [facility].  ACR Breast Density Category c: The breasts are heterogeneously dense, which may obscure small masses.  FINDINGS: In the right breast, calcifications warrant further evaluation with magnified views. In the left breast, no mass or malignant type calcifications are identified. Images were processed with CAD.  IMPRESSION: Further evaluation is suggested for calcifications in the right breast.  RECOMMENDATION: Diagnostic mammogram of the right breast. (Code:[id number])  The patient will be contacted regarding the findings, and additional imaging will be scheduled.  BI-RADS CATEGORY  0: Incomplete. Need additional imaging evaluation and/or prior mammograms for comparison.   Electronically Signed   By: Ulyess Blossom M.D.   On: 08/28/2013 07:13   Mm Rt Breast Bx W Loc Dev 1st Lesion Image Bx Spec Stereo Guide  09/01/2013   ADDENDUM REPORT: 09/01/2013 14:32  PATHOLOGY: DUCTAL CARCINOMA IN SITU WITH CALCIFICATIONS AND DUCTAL  CARCINOMA IN SITU WITHOUT CALCIFICATIONS: PATHOLOGY: DUCTAL CARCINOMA IN SITU WITH CALCIFICATIONS AND DUCTAL CARCINOMA IN SITU WITHOUT CALCIFICATIONS  CONCORDANT:  Yes  I discussed these results and the recommendations below with the patient by telephone on 08/31/2013 at 2:20 p.m. All of her questions were answered. She denies significant pain or bleeding at the biopsy site. She does report some warmth at the biopsy site and has been advised that if this continues or increases or if  she develops other signs of potential infection, such as erythema or fever that she should contact us to have the biopsy site evaluated.  RECOMMENDATION: The patient has been scheduled for surgical consultation with Dr. Ninfa Linden at [facility] on 09/05/2013 at 9:30 a.m. Followup breast MRI is pending scheduling.   Electronically Signed   By: Willadean Carol M.D.   On: 09/01/2013 14:32   09/01/2013   CLINICAL DATA:  The patient presents for stereotactic biopsy of calcifications in the upper-outer portion of the right breast  EXAM: Right STEREOTACTIC CORE NEEDLE BIOPSY  COMPARISON:  08/25/2013 and prior  FINDINGS: The patient and I discussed the procedure of stereotactic-guided biopsy including benefits and alternatives. We discussed the high likelihood of a successful procedure. We discussed the risks of the procedure including infection, bleeding, tissue injury, clip migration, and inadequate sampling. Informed written consent was given. The  usual time out protocol was performed immediately prior to the procedure.  Using sterile technique and 2% Lidocaine as local anesthetic, under stereotactic guidance, a 10 gauge mammotome device was used to perform core needle biopsy of one of the groupings of calcifications in the upper outer portion of the right breast using a lateral to medial approach. Specimen radiograph was performed showing 1 core containing a linear grouping of calcifications. Specimens with calcifications are identified for pathology.  At the conclusion of the procedure, a bow tie shaped tissue marker clip was deployed into the biopsy cavity. Follow-up 2-view mammogram confirmed clip to be positioned 1.7 cm medial to the biopsied area.  IMPRESSION: Stereotactic-guided biopsy of right breast calcifications as described above. No apparent complications.  Electronically Signed: By: Willadean Carol M.D. On: 08/31/2013 16:56     LABS:    Chemistry      Component Value Date/Time   NA 140  12/13/2013 1057      Component Value Date/Time   CALCIUM 9.2 12/13/2013 1057      Lab Results  Component Value Date   WBC 8.1 12/13/2013   HGB 12.9 12/13/2013   HCT 38.8 12/13/2013   MCV 89.4 12/13/2013   PLT 237 12/13/2013       PATHOLOGY: ADDITIONAL INFORMATION: 2. PROGNOSTIC INDICATORS - ACIS Results: IMMUNOHISTOCHEMICAL AND MORPHOMETRIC ANALYSIS BY THE AUTOMATED CELLULAR IMAGING SYSTEM (ACIS) Estrogen Receptor: 99%, POSITIVE, STRONG STAINING INTENSITY Progesterone Receptor: 99%, POSITIVE, STRONG STAINING INTENSITY REFERENCE RANGE ESTROGEN RECEPTOR NEGATIVE <1% POSITIVE =>1% PROGESTERONE RECEPTOR NEGATIVE <1% POSITIVE =>1% All controls stained appropriately Enid Cutter MD Pathologist, Electronic Signature ( Signed 09/06/2013) FINAL DIAGNOSIS Diagnosis 1. Breast, right, needle core biopsy, 10 o'clock (calcifications) - DUCTAL CARCINOMA IN SITU WITH CALCIFICATIONS AND NECROSIS. 2. Breast, right, needle core biopsy, 10 o'clock, (without calcifications) - DUCTAL CARCINOMA IN SITU. 1 of 2 FINAL for Dupler, Sander [id number]) Microscopic Comment 1. and 2. Grading is best performed at excision. The DCIS is low to intermediate grade in these biopsies. Estrogen and progesterone receptors will be performed. Dr. Avis Epley agrees. Called to [facility] on 09/01/13. (JDP;gt, 09/01/13) Claudette Laws MD Pathologist, Electronic Signature (Case signed 09/01/2013) Specimen Gross and Clinical Information  ASSESSMENT/PLAN    43 year old female with new diagnosis of ductal carcinoma in situ of the left breast found on a recent screening mammogram. Tumor is ER positive PR positive. She is s/p bilateral nipple sparing mastectomies with immediate reconstruction and will have her final reconstruction on 01/10/14.  She was informed by Dr. Carvel Getting, a medical oncologist at [facility] that she did not need to start Tamoxifen.  I will review this with the patient and call her, as  the patient could not wait for Dr. Humphrey Rolls at today's visit.  The patient and I reviewed breast exams, reasons to order scans, and discussed survivorship as well.    She will return in 3 months for labs, and evaluation.    I spent 25 minutes counseling the patient face to face.  The total time spent in the appointment was 30 minutes.  Minette Headland, [place] [phone] 12/13/2013, 1:53 PM

## 2013-12-18 NOTE — Progress Notes (Signed)
Ok thanks for clarification

## 2014-03-09 ENCOUNTER — Telehealth: Payer: Self-pay | Admitting: Adult Health

## 2014-03-09 NOTE — Telephone Encounter (Signed)
Called pt to advise appt 6/23 has been moved to 7/21 per LC req. LVM with new appt d/t 7/21 @ 10.45am and mailed appt calendar.

## 2014-03-20 ENCOUNTER — Ambulatory Visit: Payer: 59 | Admitting: Adult Health

## 2014-03-20 ENCOUNTER — Other Ambulatory Visit: Payer: 59

## 2014-04-03 ENCOUNTER — Telehealth: Payer: Self-pay | Admitting: Oncology

## 2014-04-03 NOTE — Telephone Encounter (Signed)
, °

## 2014-04-17 ENCOUNTER — Other Ambulatory Visit: Payer: 59

## 2014-04-17 ENCOUNTER — Ambulatory Visit: Payer: 59 | Admitting: Adult Health

## 2014-04-17 ENCOUNTER — Ambulatory Visit: Payer: 59

## 2014-08-18 IMAGING — MG MM DIGITAL DIAGNOSTIC LIMITED*R*
2 series · 2 of 2 positions shown · non-contrast
Comparison: 08/25/2013

CLINICAL DATA: Calcifications right breast identified on recent
screening mammogram.

EXAM:
DIGITAL DIAGNOSTIC  RIGHT MAMMOGRAM

[R CC]
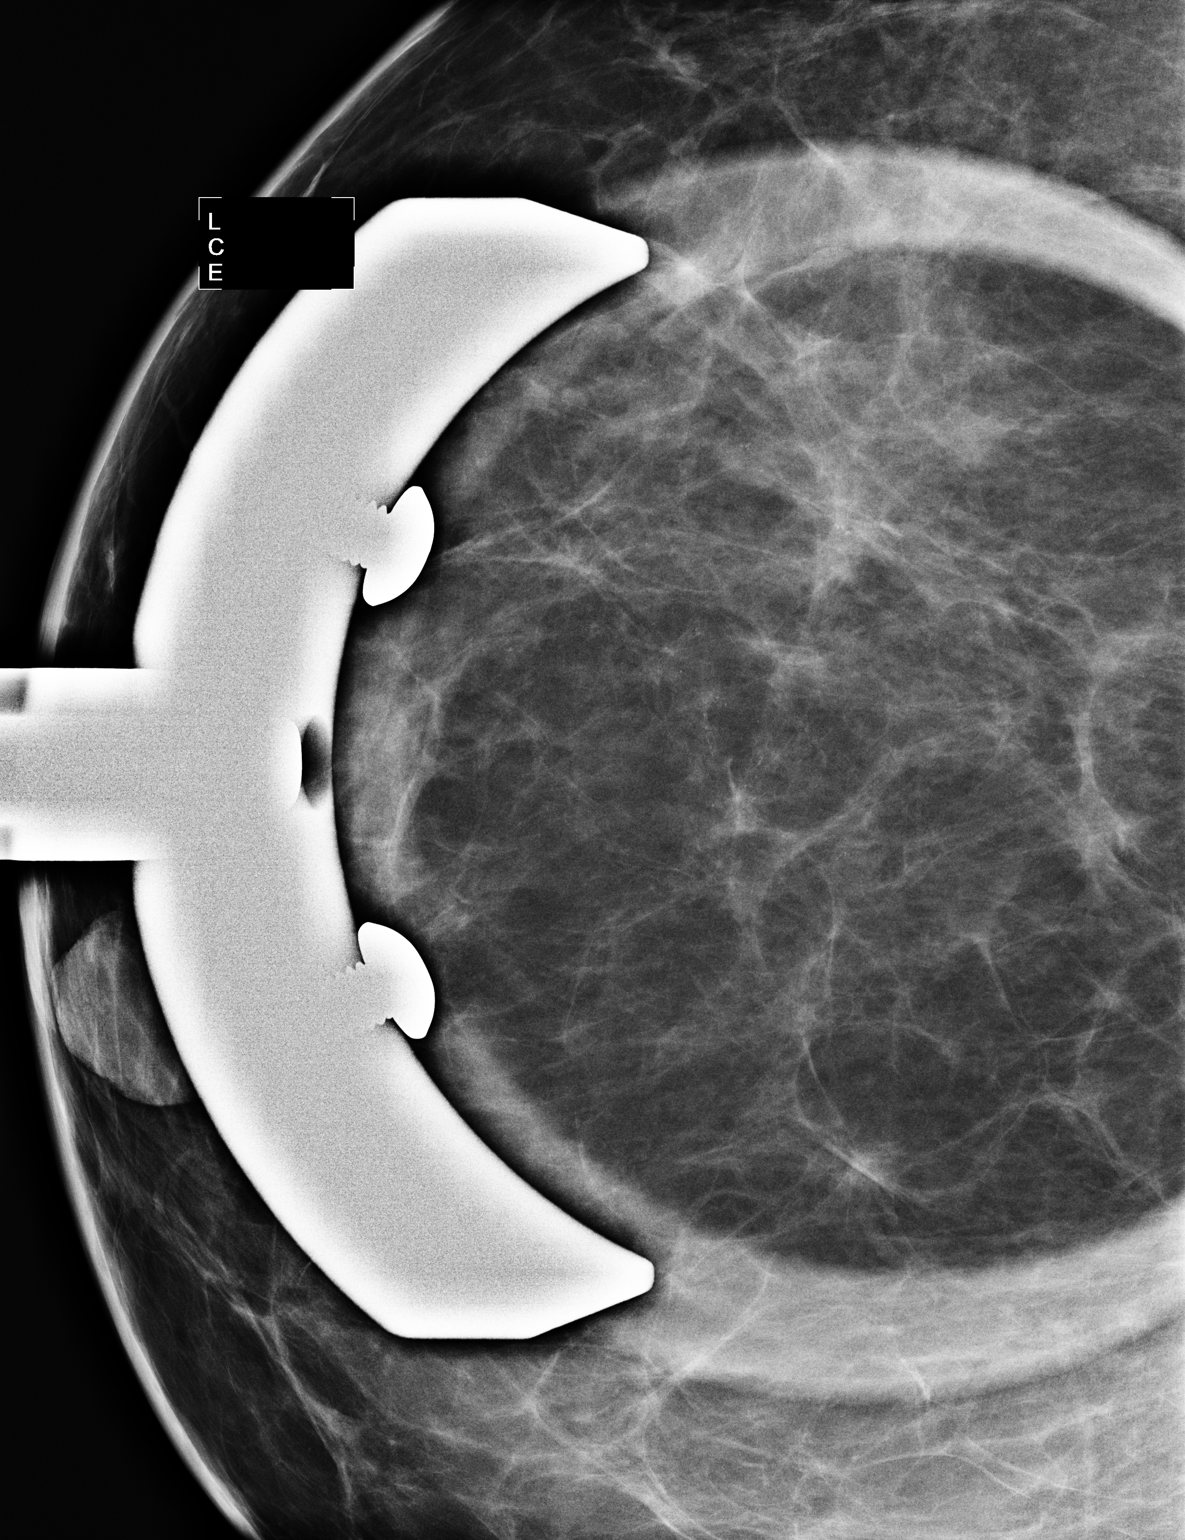

[R ML]
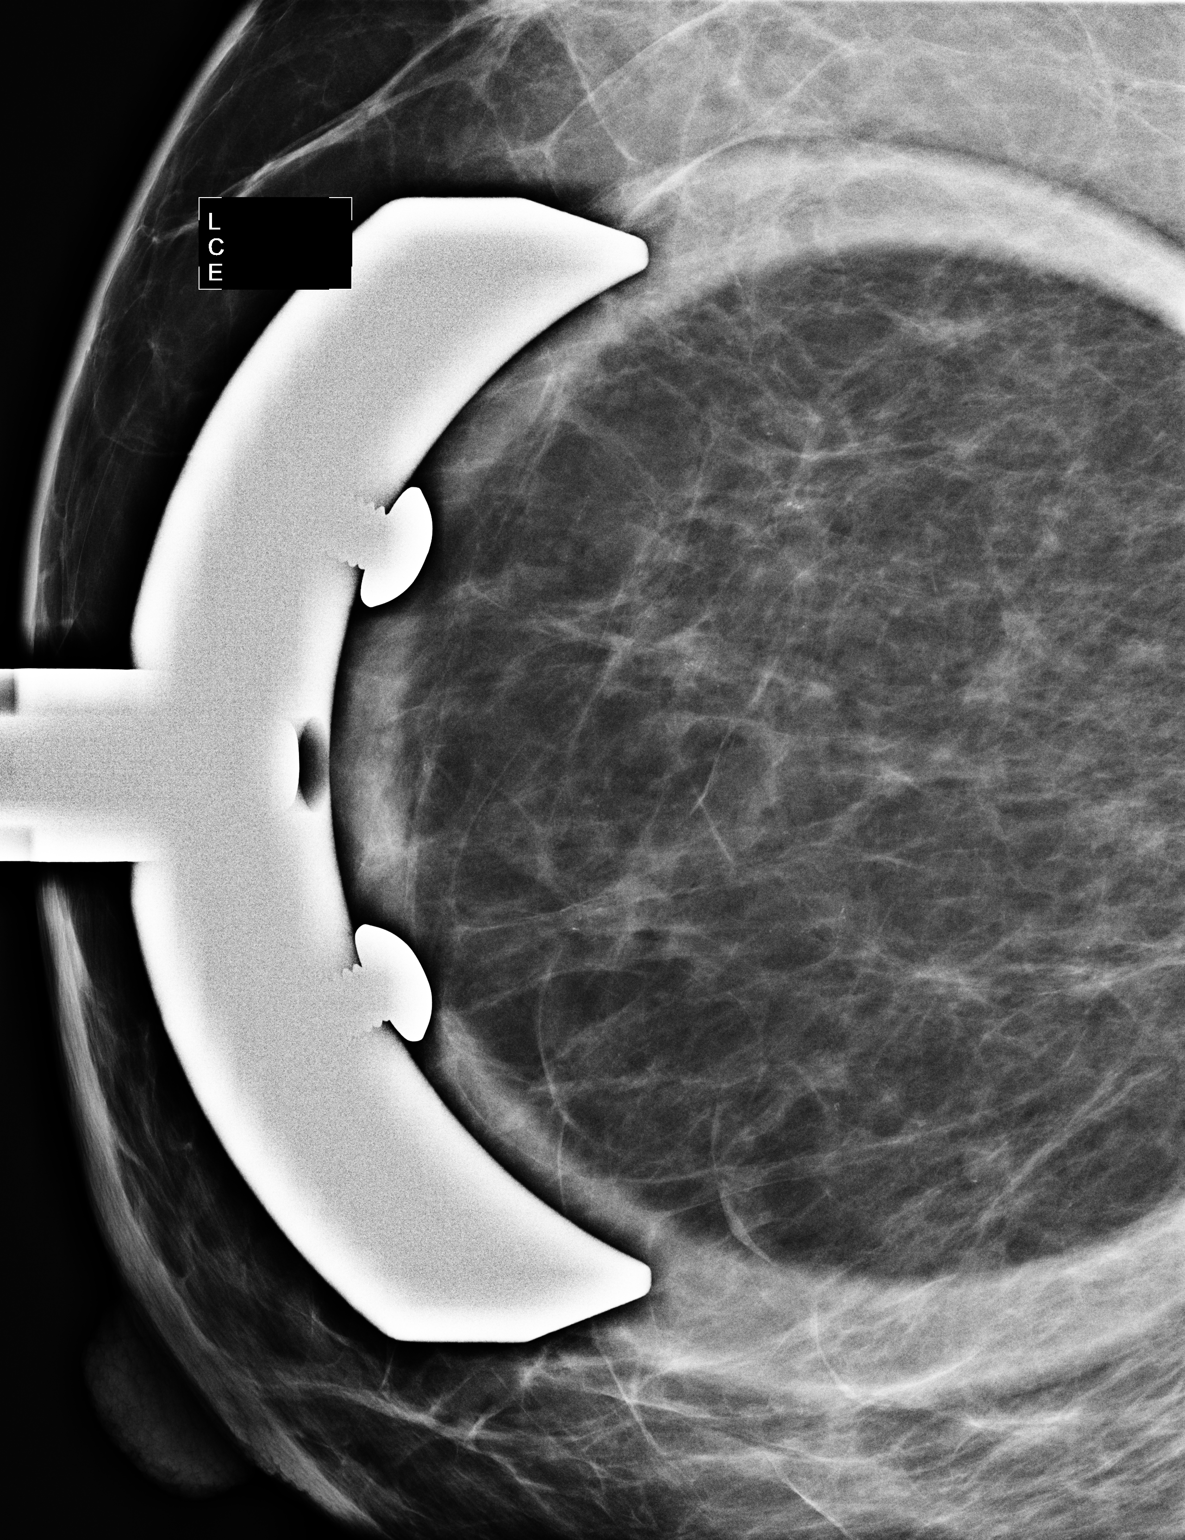

[2 of 2 positions shown; findings below may reference images not displayed]

ACR Breast Density Category b: There are scattered areas of
fibroglandular density.
FINDINGS: Magnification views of the slightly outer and upper right breast
demonstrate a region of scattered and clustered faint heterogeneous
calcifications, some of which appear linearly oriented. These
calcifications span approximately 5 x 2.6 x 2.5 cm. There is no
associated mass.
IMPRESSION: Faint heterogeneous calcifications in the upper central right
breast. Malignancy cannot be excluded.

RECOMMENDATION:
Stereotactic biopsy is recommended and has been scheduled for
08/31/2013. The procedure for stereotactic biopsy was discussed with
the patient in detail today.

I have discussed the findings and recommendations with the patient.
Results were also provided in writing at the conclusion of the
visit. If applicable, a reminder letter will be sent to the patient
regarding the next appointment.

BI-RADS CATEGORY  4: Suspicious abnormality - biopsy should be
considered.
